# Patient Record
Sex: Male | Born: 1980 | Race: Black or African American | Hispanic: No | Marital: Single | Smoking: Current every day smoker
Health system: Southern US, Community
[De-identification: ages and names within clinical notes are randomized; demographics above are authoritative.]

## PROBLEM LIST (undated history)

## (undated) DIAGNOSIS — F17200 Nicotine dependence, unspecified, uncomplicated: Secondary | ICD-10-CM

## (undated) HISTORY — DX: Nicotine dependence, unspecified, uncomplicated: F17.200

---

## 2019-05-06 ENCOUNTER — Encounter (HOSPITAL_COMMUNITY): Payer: Self-pay

## 2019-05-06 ENCOUNTER — Other Ambulatory Visit: Payer: Self-pay

## 2019-05-06 ENCOUNTER — Ambulatory Visit (HOSPITAL_COMMUNITY)
Admission: EM | Admit: 2019-05-06 | Discharge: 2019-05-06 | Disposition: A | Discharge status: Home / Self Care | Payer: Self-pay | Attending: Family Medicine | Admitting: Family Medicine

## 2019-05-06 DIAGNOSIS — K047 Periapical abscess without sinus: Secondary | ICD-10-CM

## 2019-05-06 DIAGNOSIS — K0889 Other specified disorders of teeth and supporting structures: Secondary | ICD-10-CM

## 2019-05-06 MED ORDER — CEFTRIAXONE SODIUM 1 G IJ SOLR
INTRAMUSCULAR | Status: AC
  Filled 2019-05-06: qty 10

## 2019-05-06 MED ORDER — IBUPROFEN 800 MG PO TABS: 800.0000 mg | ORAL_TABLET | Freq: Three times a day (TID) | ORAL | 0 refills | Status: DC

## 2019-05-06 MED ORDER — HYDROCODONE-ACETAMINOPHEN 5-325 MG PO TABS: 1.0000 | ORAL_TABLET | ORAL | 0 refills | Status: DC | PRN

## 2019-05-06 MED ORDER — KETOROLAC TROMETHAMINE 60 MG/2ML IM SOLN
INTRAMUSCULAR | Status: AC
  Filled 2019-05-06: qty 2

## 2019-05-06 MED ORDER — CEFTRIAXONE SODIUM 1 G IJ SOLR
1.0000 g | Freq: Once | INTRAMUSCULAR | Status: AC
  Administered 2019-05-06: 1 g via INTRAMUSCULAR

## 2019-05-06 MED ORDER — KETOROLAC TROMETHAMINE 60 MG/2ML IM SOLN
60.0000 mg | Freq: Once | INTRAMUSCULAR | Status: AC
  Administered 2019-05-06: 60 mg via INTRAMUSCULAR

## 2019-05-06 MED ORDER — AMOXICILLIN-POT CLAVULANATE 875-125 MG PO TABS: 1.0000 | ORAL_TABLET | Freq: Two times a day (BID) | ORAL | 0 refills | Status: AC

## 2019-05-06 NOTE — Discharge Instructions (Signed)
Take the Augmentin twice a day for 10 days.  Prescribed Norco/Vicodin that I would like for you to primarily take at night for moderate to severe pain.  During the day I would like for you to take one of the 800 mg ibuprofen every 8 hours.  You may take 2 regular strength Tylenol in addition to this.    Please call the dentist office attached or utilize one of the low-cost community dental services for initial evaluation.  This may require dental surgery however would like for you to follow-up with a dentist first.  If you have worsening difficulty swallowing, develop a fever, difficulty opening or closing your mouth or worsening swelling in your neck and face I like for you emergency department for evaluation as you may need stronger antibiotics.

## 2019-05-06 NOTE — ED Provider Notes (Addendum)
[facility]    CSN: [id number] Arrival date & time: 05/06/19  1003      History   Chief Complaint Chief Complaint  Patient presents with  . Dental Pain    HPI Tyler Johnston is a 39 y.o. male.   Patient reports for 2 to 3 days of worsening left-sided lower dental pain.  He believes that his wisdom tooth is coming in.  He reports pain with swallowing.  He denies discharge but does feel swelling next his time in the backside of his mouth.  Denies fever and chills.  Denies issue or injury with this tooth before.  He does not have a dentist.     History reviewed. No pertinent past medical history.  There are no problems to display for this patient.   History reviewed. No pertinent surgical history.     Home Medications    Prior to Admission medications   Medication Sig Start Date End Date Taking? Authorizing Provider  amoxicillin-clavulanate (AUGMENTIN) 875-125 MG tablet Take 1 tablet by mouth 2 (two) times daily for 10 days. 05/06/19 05/16/19  Carlisha Wisler, Veryl Speak, PA-C  HYDROcodone-acetaminophen (NORCO/VICODIN) 5-325 MG tablet Take 1-2 tablets by mouth every 4 (four) hours as needed for moderate pain or severe pain. 05/06/19   Kalicia Dufresne, Veryl Speak, PA-C  ibuprofen (ADVIL) 800 MG tablet Take 1 tablet (800 mg total) by mouth 3 (three) times daily. 05/06/19   Gerome Kokesh, Veryl Speak, PA-C    Family History Family History  Problem Relation Age of Onset  . Cancer Mother   . Congestive Heart Failure Father     Social History Social History   Tobacco Use  . Smoking status: Current Every Day Smoker    Packs/day: 0.50    Years: 15.00    Pack years: 7.50    Types: Cigarettes  . Smokeless tobacco: Never Used  Substance Use Topics  . Alcohol use: Yes  . Drug use: Yes    Types: Marijuana     Allergies   Patient has no known allergies.   Review of Systems Review of Systems  Constitutional: Negative for chills and fever.  HENT: Positive for dental problem and ear pain. Negative  for facial swelling, hearing loss, mouth sores, sore throat and trouble swallowing.   Respiratory: Negative for cough and shortness of breath.   Cardiovascular: Negative for chest pain.  Gastrointestinal: Negative for nausea and vomiting.  Musculoskeletal: Negative for neck pain and neck stiffness.     Physical Exam Triage Vital Signs ED Triage Vitals  Enc Vitals Group     BP 05/06/19 1028 (!) 145/95     Pulse Rate 05/06/19 1028 80     Resp 05/06/19 1028 19     Temp 05/06/19 1028 98.5 F (36.9 C)     Temp Source 05/06/19 1028 Oral     SpO2 05/06/19 1028 98 %     Weight 05/06/19 1025 233 lb 9.6 oz (106 kg)     Height --      Head Circumference --      Peak Flow --      Pain Score 05/06/19 1023 10     Pain Loc --      Pain Edu? --      Excl. in [place]? --    No data found.  Updated Vital Signs BP (!) 145/95 (BP Location: Right Arm)   Pulse 80   Temp 98.5 F (36.9 C) (Oral)   Resp 19   Wt 233 lb 9.6  oz (106 kg)   SpO2 98%   Visual Acuity Right Eye Distance:   Left Eye Distance:   Bilateral Distance:    Right Eye Near:   Left Eye Near:    Bilateral Near:     Physical Exam Vitals and nursing note reviewed.  Constitutional:      Appearance: He is well-developed. He is not ill-appearing.  HENT:     Head: Normocephalic and atraumatic.     Mouth/Throat:     Lips: Pink.     Mouth: Mucous membranes are moist.     Dentition: Abnormal dentition. Dental tenderness, gingival swelling and dental abscesses present.     Tongue: No lesions. Tongue does not deviate from midline.     Palate: No mass.     Pharynx: Oropharynx is clear. Uvula midline.   Eyes:     Conjunctiva/sclera: Conjunctivae normal.  Cardiovascular:     Rate and Rhythm: Normal rate and regular rhythm.     Heart sounds: No murmur.  Pulmonary:     Effort: Pulmonary effort is normal. No respiratory distress.     Breath sounds: Normal breath sounds.  Abdominal:     Palpations: Abdomen is soft.      Tenderness: There is no abdominal tenderness.  Musculoskeletal:     Cervical back: Neck supple. Tenderness present.  Lymphadenopathy:     Cervical: No cervical adenopathy.  Skin:    General: Skin is warm and dry.  Neurological:     Mental Status: He is alert.      UC Treatments / Results  Labs (all labs ordered are listed, but only abnormal results are displayed) Labs Reviewed - No data to display  EKG   Radiology No results found.  Procedures Procedures (including critical care time)  Medications Ordered in UC Medications  cefTRIAXone (ROCEPHIN) injection 1 g (1 g Intramuscular Given 05/06/19 1116)  ketorolac (TORADOL) injection 60 mg (60 mg Intramuscular Given 05/06/19 1116)    Initial Impression / Assessment and Plan / UC Course  I have reviewed the triage vital signs and the nursing notes.  Pertinent labs & imaging results that were available during my care of the patient were reviewed by me and considered in my medical decision making (see chart for details).     #Dental abscess Patient 39 year old male presenting with acute dental pain.  Symptoms and exam consistent with dental abscess.  He is afebrile and nontachycardic in clinic.  Lower concern for deep tissue infection at this time believe we can treat him outpatient.  Patient given dental resources.  1 g Rocephin in clinic and started on Augmentin twice daily for 10 days.  Ketorolac injection today.  Short course of Norco given for nighttime pain relief.  Otherwise 800 milligram ibuprofen every 8 hours. -Strict emergency department follow-up precautions were discussed such as fever, increased facial swelling or increased difficulty swallowing.  Patient verbalized understanding these instructions and will follow up with a dentist soon as possible.  Final Clinical Impressions(s) / UC Diagnoses   Final diagnoses:  Dental abscess  Pain, dental     Discharge Instructions     Take the Augmentin twice a day for  10 days.  Prescribed Norco/Vicodin that I would like for you to primarily take at night for moderate to severe pain.  During the day I would like for you to take one of the 800 mg ibuprofen every 8 hours.  You may take 2 regular strength Tylenol in addition to this.  Please call the dentist office attached or utilize one of the low-cost community dental services for initial evaluation.  This may require dental surgery however would like for you to follow-up with a dentist first.  If you have worsening difficulty swallowing, develop a fever, difficulty opening or closing your mouth or worsening swelling in your neck and face I like for you emergency department for evaluation as you may need stronger antibiotics.     ED Prescriptions    Medication Sig Dispense Auth. Provider   amoxicillin-clavulanate (AUGMENTIN) 875-125 MG tablet Take 1 tablet by mouth 2 (two) times daily for 10 days. 20 tablet Adell Koval, Veryl Speak, PA-C   HYDROcodone-acetaminophen (NORCO/VICODIN) 5-325 MG tablet Take 1-2 tablets by mouth every 4 (four) hours as needed for moderate pain or severe pain. 6 tablet Savva Beamer, Veryl Speak, PA-C   ibuprofen (ADVIL) 800 MG tablet Take 1 tablet (800 mg total) by mouth 3 (three) times daily. 21 tablet Brandley Aldrete, Veryl Speak, PA-C     I have reviewed the PDMP during this encounter.   Hermelinda Medicus, PA-C 05/06/19 1451    Kinzey Sheriff, Veryl Speak, PA-C 05/06/19 1452

## 2019-05-06 NOTE — ED Triage Notes (Signed)
Pt is here with dental pain that started 3 days ago, pt has taken Advil to relieve discomfort. 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

## 2019-07-24 DIAGNOSIS — M25522 Pain in left elbow: Secondary | ICD-10-CM

## 2019-07-24 DIAGNOSIS — E860 Dehydration: Secondary | ICD-10-CM

## 2019-07-24 HISTORY — DX: Pain in left elbow: M25.522

## 2019-07-24 HISTORY — DX: Dehydration: E86.0

## 2019-07-28 ENCOUNTER — Other Ambulatory Visit: Payer: Self-pay

## 2019-07-28 ENCOUNTER — Encounter (HOSPITAL_COMMUNITY): Payer: Self-pay

## 2019-07-28 ENCOUNTER — Ambulatory Visit (INDEPENDENT_AMBULATORY_CARE_PROVIDER_SITE_OTHER): Payer: Self-pay

## 2019-07-28 ENCOUNTER — Ambulatory Visit (HOSPITAL_COMMUNITY)
Admission: EM | Admit: 2019-07-28 | Discharge: 2019-07-28 | Disposition: A | Discharge status: Home / Self Care | Payer: Self-pay | Attending: Family Medicine | Admitting: Family Medicine

## 2019-07-28 DIAGNOSIS — M25522 Pain in left elbow: Secondary | ICD-10-CM

## 2019-07-28 MED ORDER — DICLOFENAC SODIUM 75 MG PO TBEC: 75.0000 mg | DELAYED_RELEASE_TABLET | Freq: Two times a day (BID) | ORAL | 0 refills | Status: DC
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
## 2019-08-06 ENCOUNTER — Encounter (HOSPITAL_COMMUNITY): Payer: Self-pay

## 2019-08-06 ENCOUNTER — Other Ambulatory Visit: Payer: Self-pay

## 2019-08-06 ENCOUNTER — Ambulatory Visit (HOSPITAL_COMMUNITY)
Admission: EM | Admit: 2019-08-06 | Discharge: 2019-08-06 | Disposition: A | Discharge status: Home / Self Care | Payer: Self-pay | Attending: Family Medicine | Admitting: Family Medicine

## 2019-08-06 DIAGNOSIS — E860 Dehydration: Secondary | ICD-10-CM

## 2019-08-06 DIAGNOSIS — R519 Headache, unspecified: Secondary | ICD-10-CM

## 2019-08-06 DIAGNOSIS — G4486 Cervicogenic headache: Secondary | ICD-10-CM

## 2019-08-06 DIAGNOSIS — R42 Dizziness and giddiness: Secondary | ICD-10-CM

## 2019-08-06 LAB — POCT URINALYSIS DIP (DEVICE)
Bilirubin Urine: NEGATIVE
Glucose, UA: NEGATIVE mg/dL
Hgb urine dipstick: NEGATIVE
Ketones, ur: NEGATIVE mg/dL
Leukocytes,Ua: NEGATIVE
Nitrite: NEGATIVE
Protein, ur: NEGATIVE mg/dL
Specific Gravity, Urine: >=1.03 (ref 1.005–1.030)
Urobilinogen, UA: 0.2 mg/dL (ref 0.0–1.0)
pH: 6 (ref 5.0–8.0)

## 2019-08-06 MED ORDER — MECLIZINE HCL 25 MG PO TABS: 25.0000 mg | ORAL_TABLET | Freq: Two times a day (BID) | ORAL | 0 refills | Status: DC | PRN

## 2019-08-06 MED ORDER — IBUPROFEN 800 MG PO TABS: 800.0000 mg | ORAL_TABLET | Freq: Three times a day (TID) | ORAL | 0 refills | Status: DC
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
## 2019-08-23 DIAGNOSIS — R42 Dizziness and giddiness: Secondary | ICD-10-CM

## 2019-08-23 DIAGNOSIS — R519 Headache, unspecified: Secondary | ICD-10-CM

## 2019-08-23 DIAGNOSIS — K047 Periapical abscess without sinus: Secondary | ICD-10-CM

## 2019-08-23 HISTORY — DX: Dizziness and giddiness: R42

## 2019-08-23 HISTORY — DX: Headache, unspecified: R51.9

## 2019-08-23 HISTORY — DX: Periapical abscess without sinus: K04.7

## 2019-09-07 ENCOUNTER — Encounter: Payer: Self-pay | Admitting: Family Medicine

## 2019-09-07 ENCOUNTER — Ambulatory Visit (INDEPENDENT_AMBULATORY_CARE_PROVIDER_SITE_OTHER): Payer: Self-pay | Admitting: Family Medicine

## 2019-09-07 VITALS — BP 129/85 | HR 86 | Temp 98.2°F | Ht 66.0 in | Wt 192.4 lb

## 2019-09-07 DIAGNOSIS — Z09 Encounter for follow-up examination after completed treatment for conditions other than malignant neoplasm: Secondary | ICD-10-CM

## 2019-09-07 DIAGNOSIS — R519 Headache, unspecified: Secondary | ICD-10-CM

## 2019-09-07 DIAGNOSIS — F17200 Nicotine dependence, unspecified, uncomplicated: Secondary | ICD-10-CM

## 2019-09-07 DIAGNOSIS — R42 Dizziness and giddiness: Secondary | ICD-10-CM

## 2019-09-07 DIAGNOSIS — M25522 Pain in left elbow: Secondary | ICD-10-CM

## 2019-09-07 DIAGNOSIS — Z0000 Encounter for general adult medical examination without abnormal findings: Secondary | ICD-10-CM

## 2019-09-07 DIAGNOSIS — E860 Dehydration: Secondary | ICD-10-CM

## 2019-09-07 LAB — POCT GLYCOSYLATED HEMOGLOBIN (HGB A1C)
HbA1c POC (<> result, manual entry): 5 % (ref 4.0–5.6)
HbA1c, POC (controlled diabetic range): 5 % (ref 0.0–7.0)
HbA1c, POC (prediabetic range): 5 % — AB (ref 5.7–6.4)
Hemoglobin A1C: 5 % (ref 4.0–5.6)

## 2019-09-07 LAB — GLUCOSE, POCT (MANUAL RESULT ENTRY): POC Glucose: 149 mg/dl — AB (ref 70–99)

## 2019-09-07 MED ORDER — IBUPROFEN 800 MG PO TABS: 800.0000 mg | ORAL_TABLET | Freq: Three times a day (TID) | ORAL | 3 refills | Status: AC

## 2019-09-07 MED ORDER — MECLIZINE HCL 25 MG PO TABS: 25.0000 mg | ORAL_TABLET | Freq: Two times a day (BID) | ORAL | 3 refills | Status: AC | PRN

## 2019-09-07 MED FILL — MECLIZINE 25 MG TABLET: 25 | 15 days supply | Qty: 30 | Fill #0

## 2019-09-07 MED FILL — IBUPROFEN 800 MG TABLET: 800 | 10 days supply | Qty: 30 | Fill #0
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
## 2019-09-08 LAB — COMPREHENSIVE METABOLIC PANEL
ALT: 26 IU/L (ref 0–44)
AST: 32 IU/L (ref 0–40)
Albumin/Globulin Ratio: 1.5 (ref 1.2–2.2)
Albumin: 4.7 g/dL (ref 4.0–5.0)
Alkaline Phosphatase: 106 IU/L (ref 48–121)
BUN/Creatinine Ratio: 11 (ref 9–20)
BUN: 14 mg/dL (ref 6–24)
Bilirubin Total: 0.3 mg/dL (ref 0.0–1.2)
CO2: 24 mmol/L (ref 20–29)
Calcium: 9.8 mg/dL (ref 8.7–10.2)
Chloride: 101 mmol/L (ref 96–106)
Creatinine, Ser: 1.3 mg/dL — ABNORMAL HIGH (ref 0.76–1.27)
GFR calc Af Amer: 79 mL/min/{1.73_m2} (ref >59)
GFR calc non Af Amer: 68 mL/min/{1.73_m2} (ref >59)
Globulin, Total: 3.2 g/dL (ref 1.5–4.5)
Glucose: 101 mg/dL — ABNORMAL HIGH (ref 65–99)
Potassium: 4.2 mmol/L (ref 3.5–5.2)
Sodium: 140 mmol/L (ref 134–144)
Total Protein: 7.9 g/dL (ref 6.0–8.5)

## 2019-09-08 LAB — CBC WITH DIFFERENTIAL/PLATELET
Basophils Absolute: 0 10*3/uL (ref 0.0–0.2)
Basos: 1 %
EOS (ABSOLUTE): 0.1 10*3/uL (ref 0.0–0.4)
Eos: 1 %
Hematocrit: 45.6 % (ref 37.5–51.0)
Hemoglobin: 15 g/dL (ref 13.0–17.7)
Immature Grans (Abs): 0 10*3/uL (ref 0.0–0.1)
Immature Granulocytes: 0 %
Lymphocytes Absolute: 2.1 10*3/uL (ref 0.7–3.1)
Lymphs: 34 %
MCH: 32.8 pg (ref 26.6–33.0)
MCHC: 32.9 g/dL (ref 31.5–35.7)
MCV: 100 fL — ABNORMAL HIGH (ref 79–97)
Monocytes Absolute: 0.3 10*3/uL (ref 0.1–0.9)
Monocytes: 5 %
Neutrophils Absolute: 3.7 10*3/uL (ref 1.4–7.0)
Neutrophils: 59 %
Platelets: 188 10*3/uL (ref 150–450)
RBC: 4.58 x10E6/uL (ref 4.14–5.80)
RDW: 11.4 % — ABNORMAL LOW (ref 11.6–15.4)
WBC: 6.2 10*3/uL (ref 3.4–10.8)

## 2019-09-08 LAB — VITAMIN D 25 HYDROXY (VIT D DEFICIENCY, FRACTURES): Vit D, 25-Hydroxy: 27.5 ng/mL — ABNORMAL LOW (ref 30.0–100.0)

## 2019-09-08 LAB — LIPID PANEL
Chol/HDL Ratio: 4.4 ratio (ref 0.0–5.0)
Cholesterol, Total: 160 mg/dL (ref 100–199)
HDL: 36 mg/dL — ABNORMAL LOW (ref >39)
LDL Chol Calc (NIH): 97 mg/dL (ref 0–99)
Triglycerides: 154 mg/dL — ABNORMAL HIGH (ref 0–149)
VLDL Cholesterol Cal: 27 mg/dL (ref 5–40)

## 2019-09-08 LAB — VITAMIN B12: Vitamin B-12: 640 pg/mL (ref 232–1245)

## 2019-09-08 LAB — TSH: TSH: 1.63 u[IU]/mL (ref 0.450–4.500)

## 2019-09-09 ENCOUNTER — Encounter: Payer: Self-pay | Admitting: Family Medicine

## 2019-09-09 DIAGNOSIS — R519 Headache, unspecified: Secondary | ICD-10-CM | POA: Insufficient documentation

## 2019-09-09 DIAGNOSIS — E860 Dehydration: Secondary | ICD-10-CM | POA: Insufficient documentation

## 2019-09-09 DIAGNOSIS — F17200 Nicotine dependence, unspecified, uncomplicated: Secondary | ICD-10-CM | POA: Insufficient documentation

## 2019-09-09 DIAGNOSIS — M25522 Pain in left elbow: Secondary | ICD-10-CM | POA: Insufficient documentation

## 2019-09-09 DIAGNOSIS — R42 Dizziness and giddiness: Secondary | ICD-10-CM | POA: Insufficient documentation

## 2019-09-14 NOTE — Progress Notes (Signed)
Pt was called @ 8:06 am to discuss his labs. A message was left for Tyler Johnston to call me back.

## 2019-09-20 MED FILL — MECLIZINE 25 MG TABLET: 25 | 15 days supply | Qty: 30 | Fill #0

## 2019-09-20 MED FILL — IBUPROFEN 800 MG TABLET: 800 | 10 days supply | Qty: 30 | Fill #0

## 2019-09-28 ENCOUNTER — Ambulatory Visit: Payer: Self-pay | Admitting: Family Medicine

## 2019-10-08 ENCOUNTER — Ambulatory Visit: Payer: Self-pay | Admitting: Family Medicine

## 2019-10-19 ENCOUNTER — Ambulatory Visit: Payer: Self-pay | Admitting: Family Medicine

## 2019-11-06 ENCOUNTER — Other Ambulatory Visit: Payer: Self-pay

## 2019-11-06 ENCOUNTER — Ambulatory Visit (HOSPITAL_COMMUNITY)
Admission: EM | Admit: 2019-11-06 | Discharge: 2019-11-06 | Disposition: A | Discharge status: Home / Self Care | Payer: Self-pay

## 2019-11-06 ENCOUNTER — Encounter (HOSPITAL_COMMUNITY): Payer: Self-pay | Admitting: Emergency Medicine

## 2019-11-06 DIAGNOSIS — R42 Dizziness and giddiness: Secondary | ICD-10-CM

## 2019-11-06 DIAGNOSIS — R519 Headache, unspecified: Secondary | ICD-10-CM

## 2019-11-06 NOTE — Discharge Instructions (Signed)
Continue your ibuprofen for headaches Continue the meclizine previous prescribed as needed  Call your primary care to follow up to discuss today's visit and follow-up  If you have a sudden severe change in your headache, persistent dizziness or other concerning symptoms go to the emergency department
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
## 2019-12-07 ENCOUNTER — Ambulatory Visit: Payer: Self-pay | Admitting: Family Medicine

## 2020-05-29 ENCOUNTER — Encounter (HOSPITAL_COMMUNITY): Payer: Self-pay

## 2020-05-29 ENCOUNTER — Ambulatory Visit (HOSPITAL_COMMUNITY)
Admission: EM | Admit: 2020-05-29 | Discharge: 2020-05-29 | Disposition: A | Discharge status: Home / Self Care | Payer: BLUE CROSS/BLUE SHIELD | Attending: Urgent Care | Admitting: Urgent Care

## 2020-05-29 ENCOUNTER — Other Ambulatory Visit: Payer: Self-pay

## 2020-05-29 DIAGNOSIS — K047 Periapical abscess without sinus: Secondary | ICD-10-CM | POA: Diagnosis not present

## 2020-05-29 DIAGNOSIS — K0889 Other specified disorders of teeth and supporting structures: Secondary | ICD-10-CM

## 2020-05-29 MED ORDER — HYDROCODONE-ACETAMINOPHEN 5-325 MG PO TABS: 1.0000 | ORAL_TABLET | Freq: Four times a day (QID) | ORAL | 0 refills | Status: AC | PRN

## 2020-05-29 MED ORDER — AMOXICILLIN-POT CLAVULANATE 875-125 MG PO TABS: 1.0000 | ORAL_TABLET | Freq: Two times a day (BID) | ORAL | 0 refills | Status: AC

## 2020-05-29 MED ORDER — NAPROXEN 500 MG PO TABS: 500.0000 mg | ORAL_TABLET | Freq: Two times a day (BID) | ORAL | 0 refills | Status: AC
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
## 2020-05-29 NOTE — ED Triage Notes (Signed)
Pt presents with front top dental pain for over a week.

## 2020-05-29 NOTE — Discharge Instructions (Signed)
Please schedule naproxen twice daily with food for your severe pain.  If you still have pain despite taking naproxen regularly, this is breakthrough pain.  You can use hydrocodone, a narcotic pain medicine, once every 4-6 hours for this.  Once your pain is better controlled, switch back to just naproxen.   Make sure you schedule an appointment with a dentist/dental surgeon as soon as possible.  You may try some of the resources below.    [facility] [phone] extension [phone]  Dr. Civils [phone] [address]  [facility] [phone] [address]  [facility] [phone] extension [phone] [place], [place], [address] First come first serve for the first 10 clients.  May do simple extractions only, no wisdom teeth or surgery.  You may try the second for Thursday of the month starting at 6:30 AM.  [employer] You may call the school to see if they are still helping to provide dental care for emergent cases.  
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
